# Patient Record
Sex: Male | Born: 1970 | Race: White | Hispanic: No | Marital: Single | Smoking: Never smoker
Health system: Southern US, Community
[De-identification: ages and names within clinical notes are randomized; demographics above are authoritative.]

---

## 2005-06-21 ENCOUNTER — Emergency Department: Payer: Self-pay | Admitting: Emergency Medicine

## 2005-06-22 ENCOUNTER — Other Ambulatory Visit: Payer: Self-pay

## 2005-07-07 ENCOUNTER — Inpatient Hospital Stay: Payer: Self-pay | Admitting: Unknown Physician Specialty

## 2007-04-19 IMAGING — CR DG CHEST 2V
1 series · 3 of 3 positions shown · non-contrast
Comparison: none

REASON FOR EXAM: anxious
COMMENTS:

PROCEDURE:     DXR - DXR CHEST PA (OR AP) AND LATERAL  - June 22, 2005  [DATE]
RESULT:          PA and lateral view reveals the cardiomediastinal
structures to be within normal limits.  The lung fields are clear.  The
vascularity is within normal limits with no effusions or pneumothoraces.

[Series 1: view not recorded · 0.17mm/px · 3 of 3 slices shown]
[im 1/3]
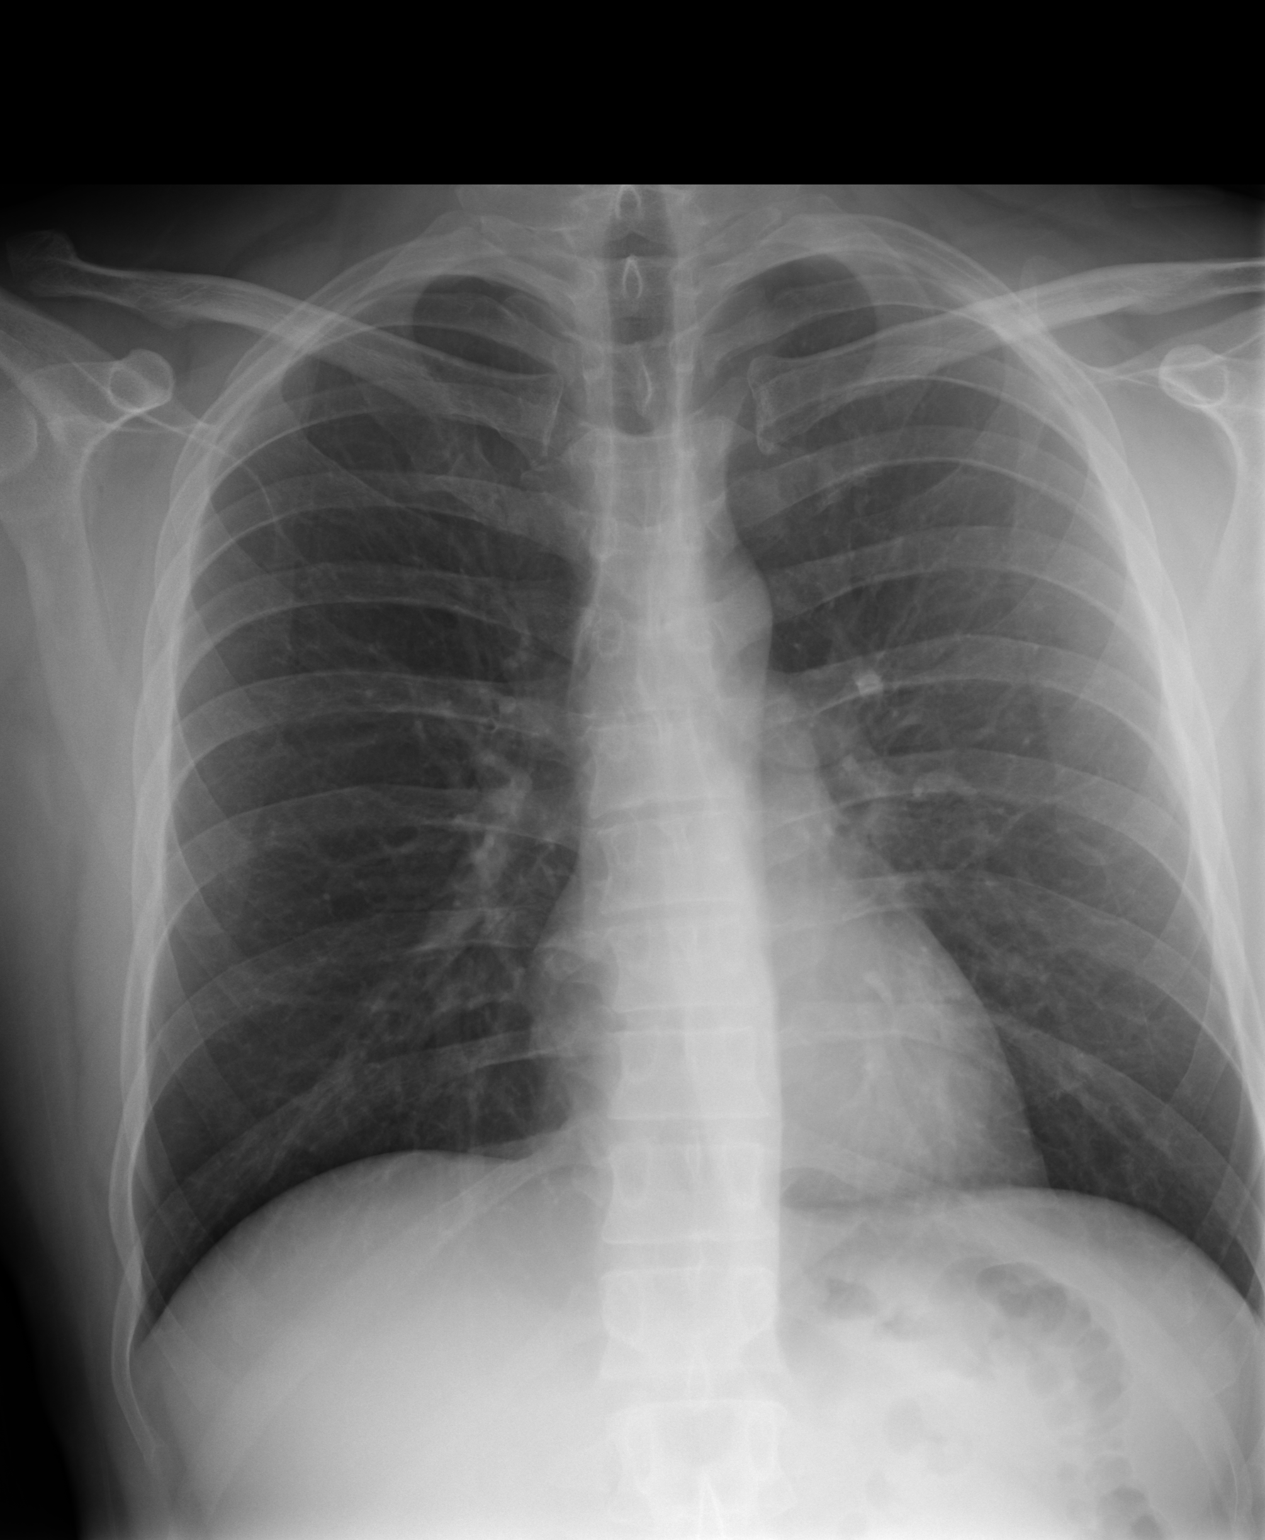
[im 2/3]
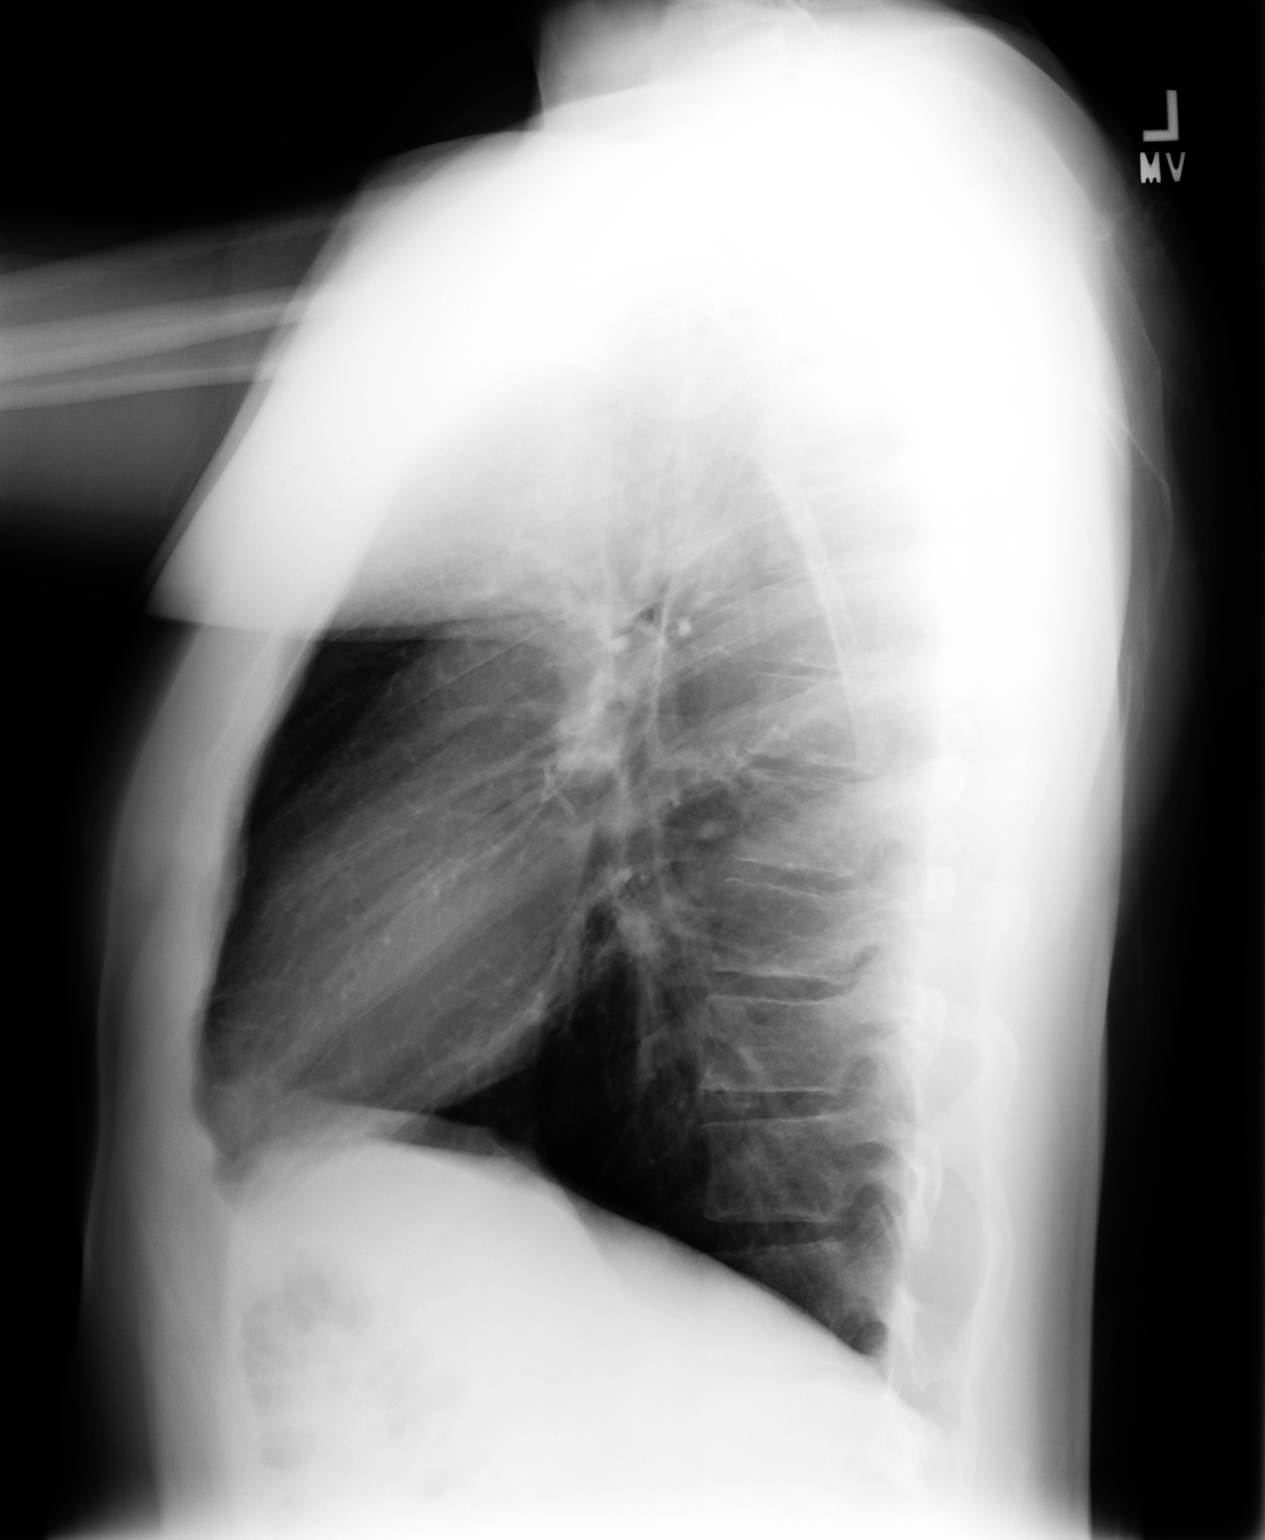
[im 3/3]
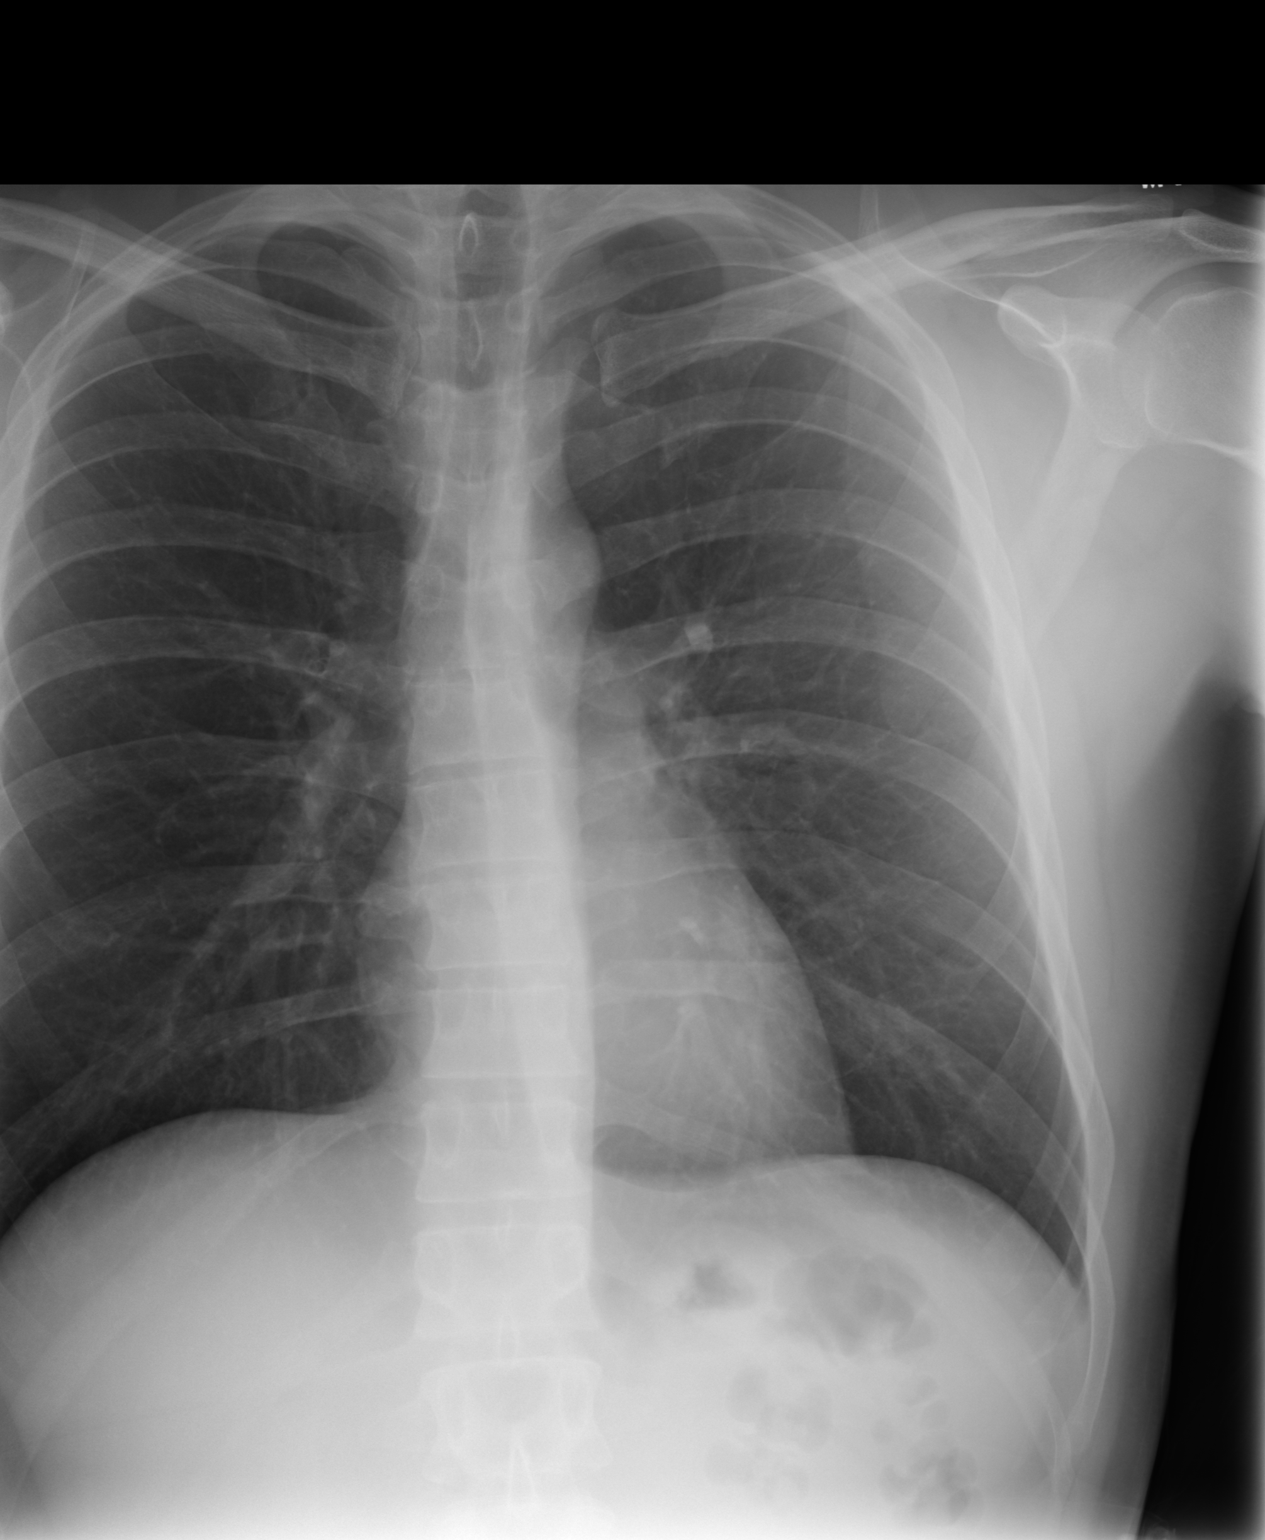

[3 of 3 positions shown; findings below may reference images not displayed]

IMPRESSION: The lung fields are clear.

## 2009-02-19 ENCOUNTER — Ambulatory Visit: Payer: Self-pay | Admitting: Internal Medicine

## 2010-07-21 ENCOUNTER — Emergency Department: Payer: Self-pay | Admitting: Internal Medicine

## 2011-06-25 ENCOUNTER — Ambulatory Visit: Payer: Self-pay | Admitting: Family Medicine

## 2013-04-21 IMAGING — US US PELVIS LIMITED
1 series · 14 of 25 positions shown · non-contrast
Comparison: None

REASON FOR EXAM: CR [id number] Testicular pain
COMMENTS:

PROCEDURE:     US  - US TESTICULAR  - June 25, 2011  [DATE]
RESULT:     Indication: Testicular pain
TECHNIQUE: Multiple gray-scale, color-flow Doppler, and spectral waveform
tracings of the testicles and testicular vasculature are presented for
review.

[Series 1: us pelvis limited · 0.08mm/px · 14 of 77 slices shown]
[im 1/77]
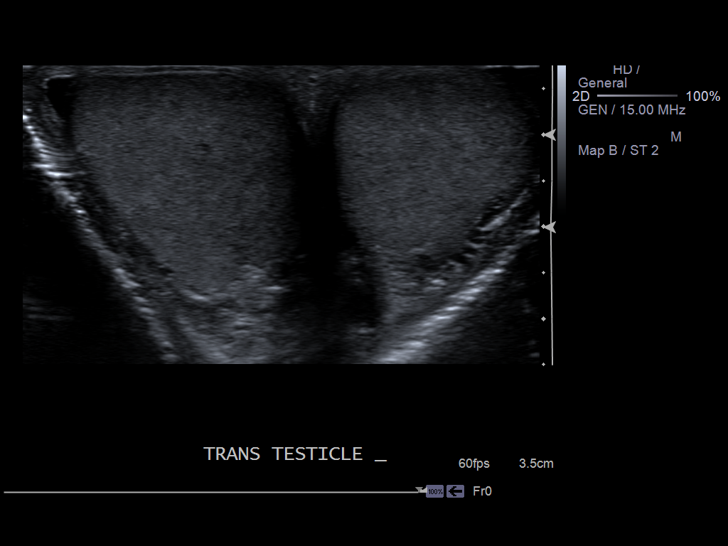
[im 7/77]
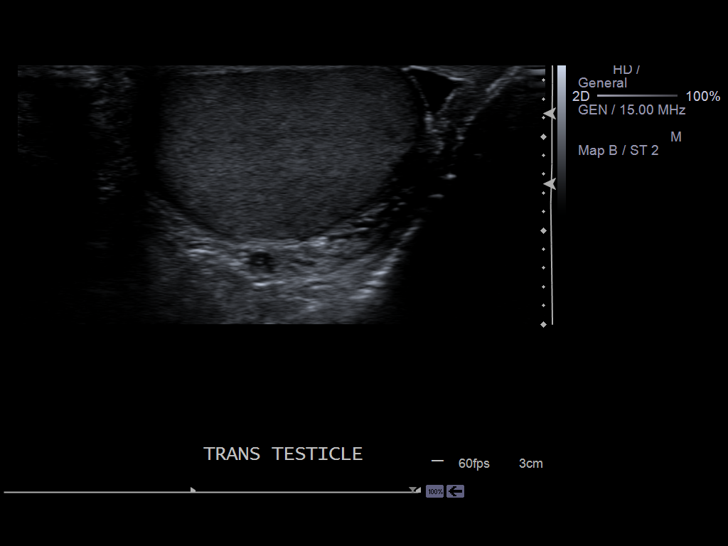
[im 13/77]
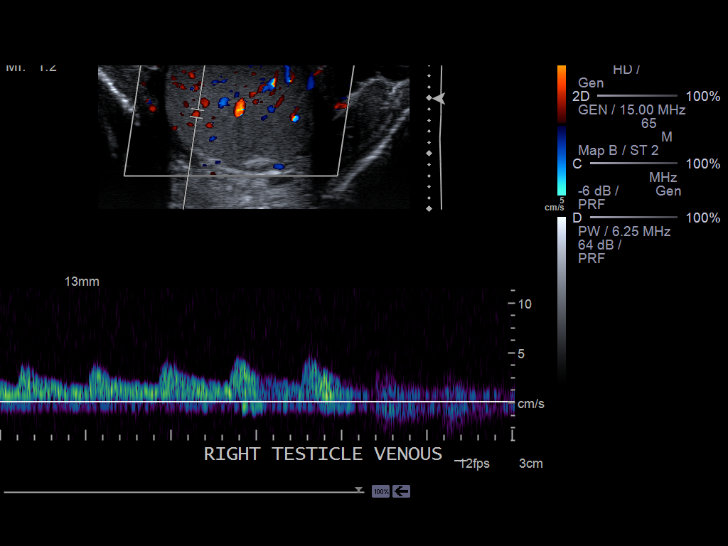
[im 20/77]
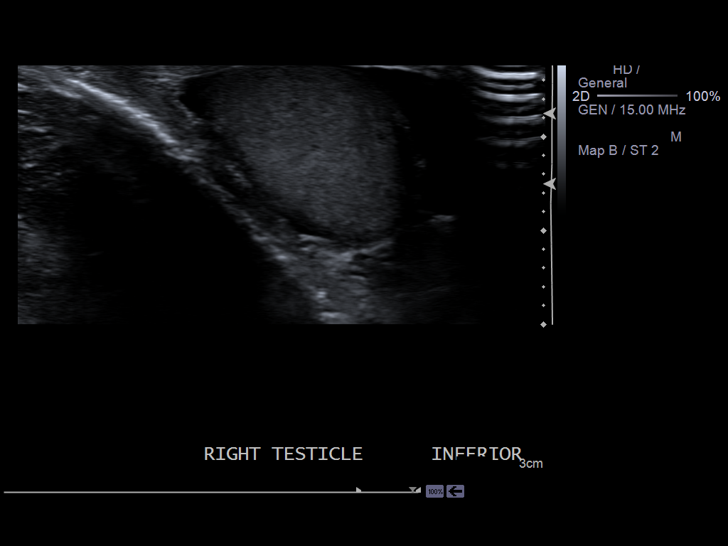
[im 26/77]
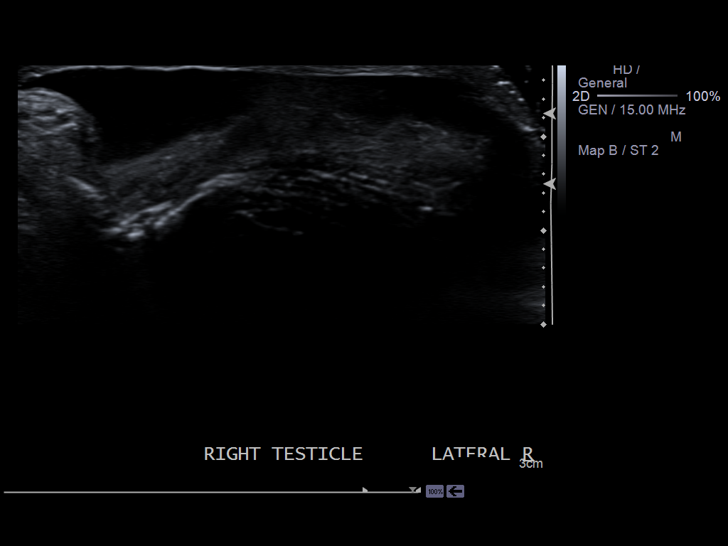
[im 29/77]
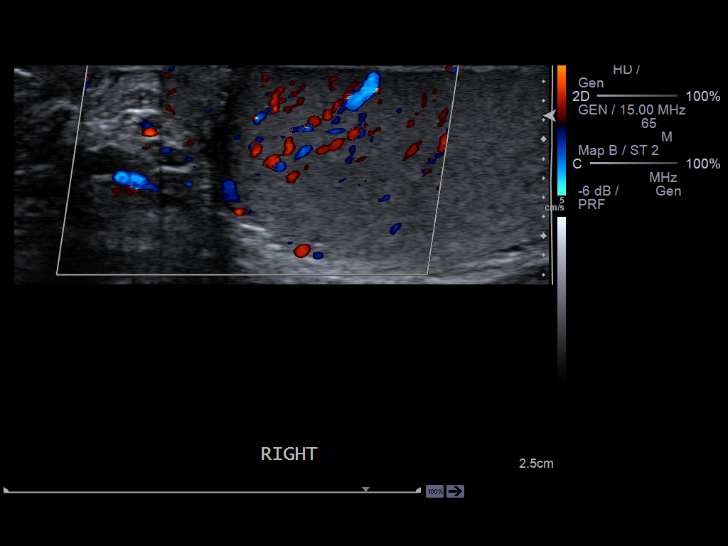
[im 35/77]
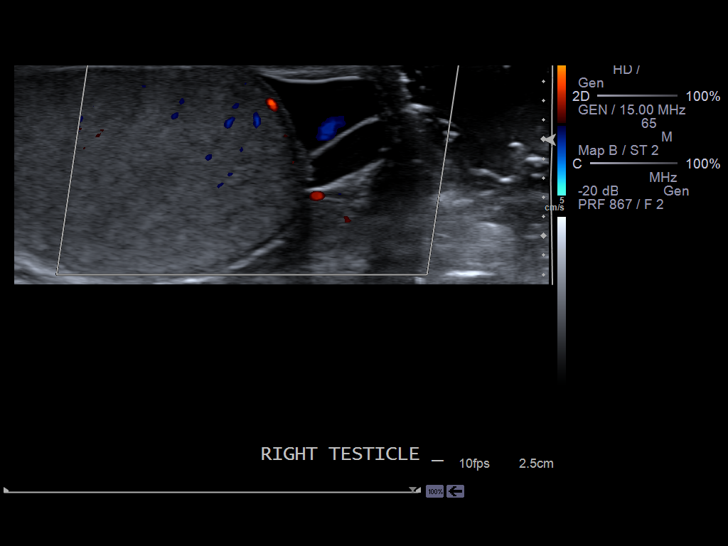
[im 42/77]
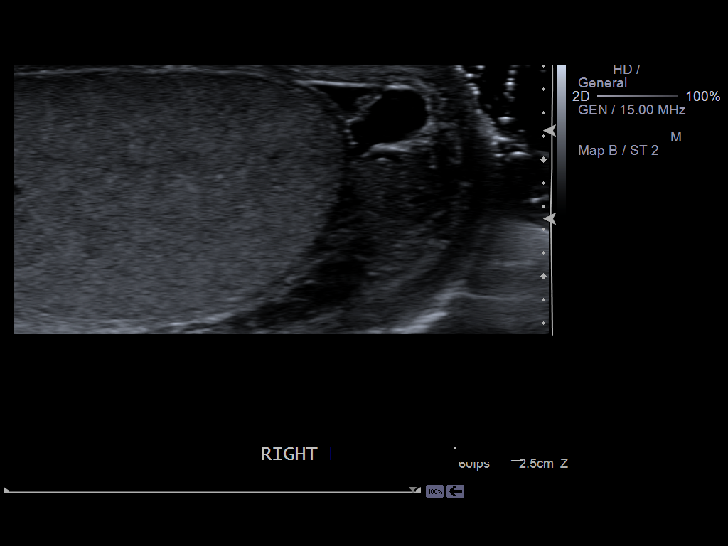
[im 48/77]
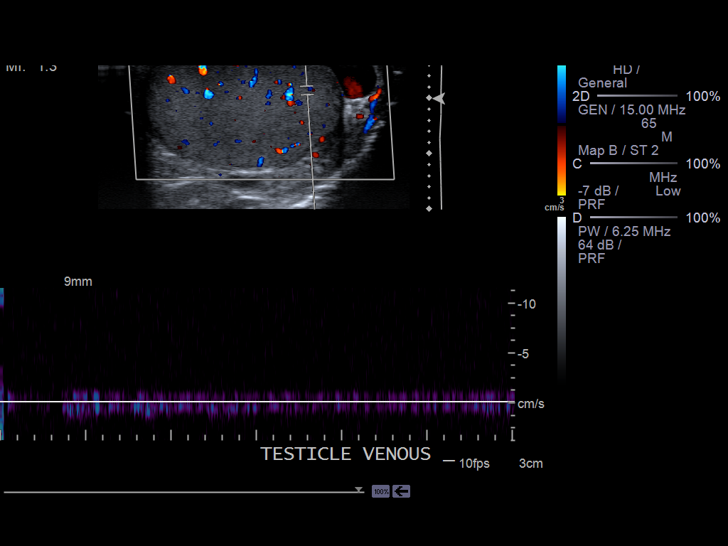
[im 51/77]
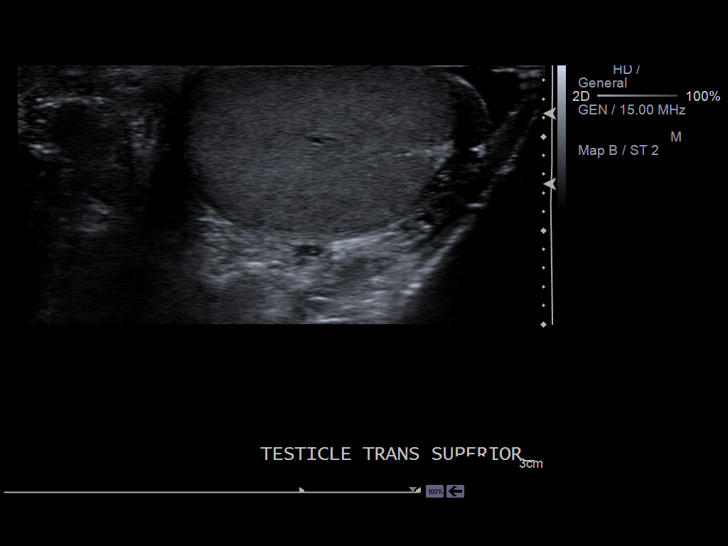
[im 58/77]
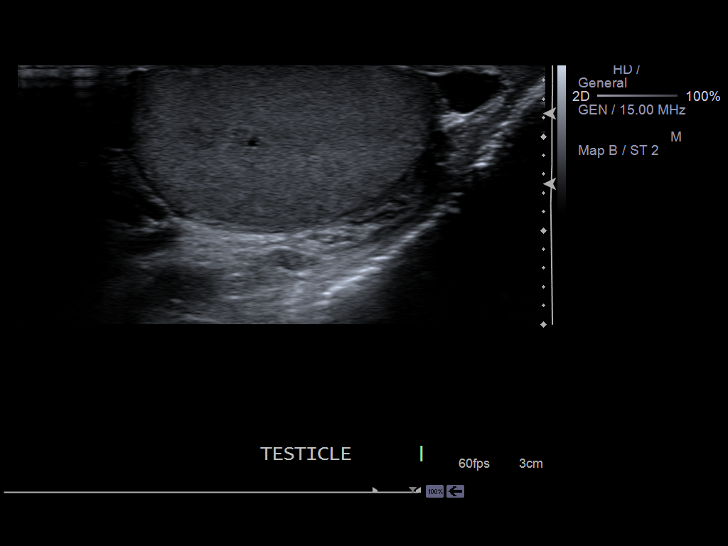
[im 64/77]
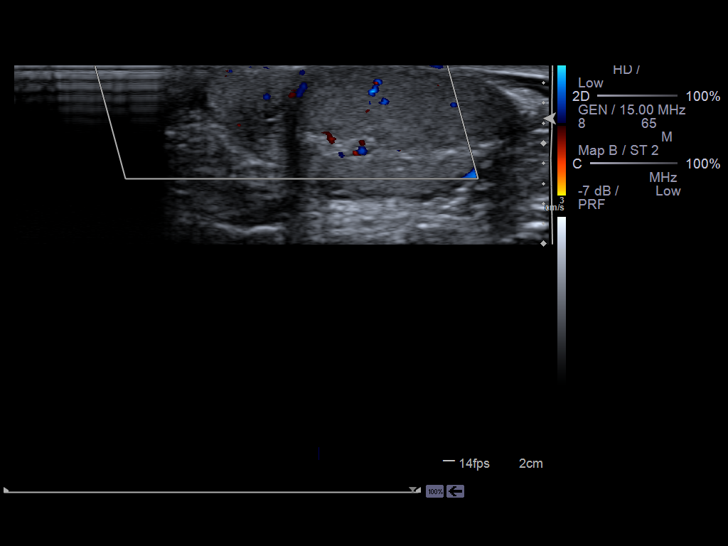
[im 70/77]
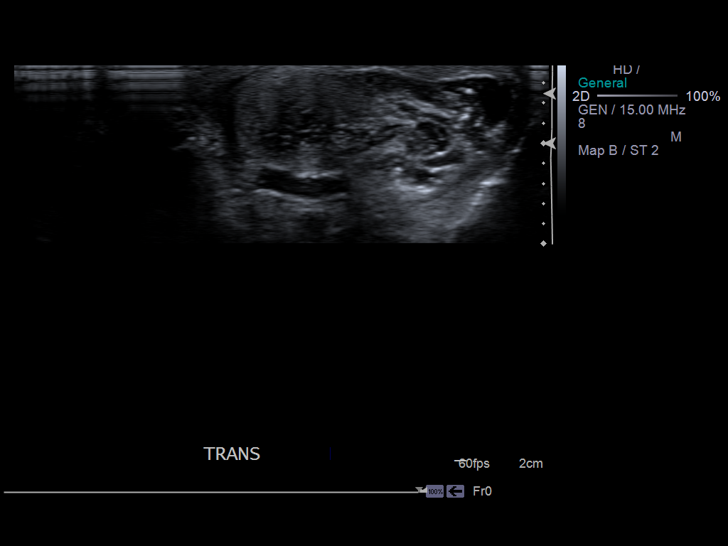
[im 77/77]
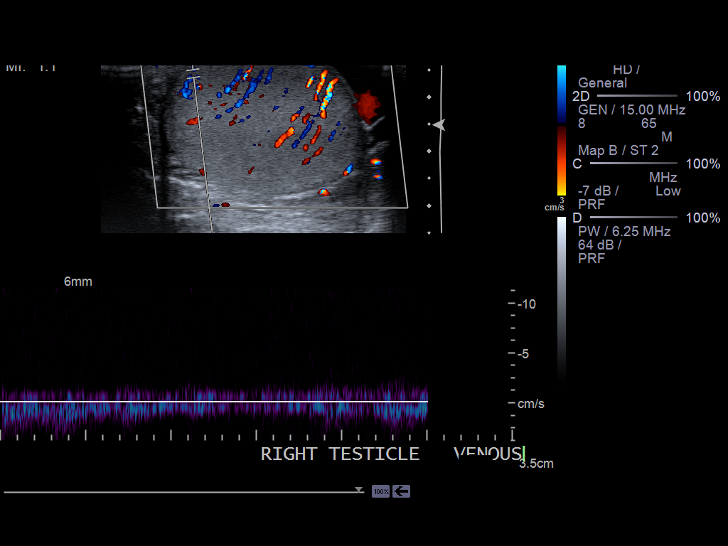

[14 of 25 positions shown; findings below may reference images not displayed]

FINDINGS: The right testicle is of normal echogenicity and measures 4 x 2.4 x 2.2 cm.
No focal mass lesions. There is normal arterial and venous blood flow. The
right epididymal head measures 0.6 x 0.7 x 0.7 cm and is normal in
appearance. No right varicocele. There is a small right hydrocele. There is
a small 10 mm anechoic mass adjacent to the inferior margin of the
epididymis and testicle likely a representing a small cyst or loculated
hydrocele.

The left testicle is of normal echogenicity and measures 3.7 x 2.8 x 1.8 cm.
No focal mass lesions. There is normal arterial and venous blood flow.  The
left epididymal head measures 0.6 x 0.8 x 1 cm and is normal in appearance.
No left varicocele. There is a small left hydrocele.
IMPRESSION: 1. No evidence of active testicular torsion.
2. Small bilateral hydroceles.

## 2018-03-15 ENCOUNTER — Telehealth: Payer: Self-pay

## 2018-04-06 NOTE — Telephone Encounter (Signed)
error 

## 2019-02-24 ENCOUNTER — Encounter: Payer: Self-pay | Admitting: Urology

## 2019-02-24 ENCOUNTER — Other Ambulatory Visit: Payer: Self-pay

## 2019-02-24 ENCOUNTER — Ambulatory Visit (INDEPENDENT_AMBULATORY_CARE_PROVIDER_SITE_OTHER): Payer: BC Managed Care – PPO | Admitting: Urology

## 2019-02-24 VITALS — BP 147/81 | HR 80 | Ht 69.5 in | Wt 184.3 lb

## 2019-02-24 DIAGNOSIS — Z125 Encounter for screening for malignant neoplasm of prostate: Secondary | ICD-10-CM | POA: Diagnosis not present

## 2019-02-24 MED ORDER — TADALAFIL 5 MG PO TABS: 5.0000 mg | ORAL_TABLET | Freq: Every day | ORAL | 6 refills | Status: AC | PRN

## 2019-02-24 NOTE — Patient Instructions (Addendum)
Prostate Cancer Screening  Prostate cancer screening is a test that is done to check for the presence of prostate cancer in men. The prostate gland is a walnut-sized gland that is located below the bladder and in front of the rectum in males. The function of the prostate is to add fluid to semen during ejaculation. Prostate cancer is the second most common type of cancer in men. Who should have prostate cancer screening?  Screening recommendations vary based on age and other risk factors. Screening is recommended if:  You are older than age 55. If you are age 55-69, talk with your health care provider about your need for screening and how often screening should be done. Because most prostate cancers are slow growing and will not cause death, screening is generally reserved in this age group for men who have a 10-15-year life expectancy.  You are younger than age 55, and you have these risk factors: ? Being a black male or a male of African descent. ? Having a father, brother, or uncle who has been diagnosed with prostate cancer. The risk is higher if your family member's cancer occurred at an early age. Screening is not recommended if:  You are younger than age 40.  You are between the ages of 40 and 54 and you have no risk factors.  You are 70 years of age or older. At this age, the risks that screening can cause are greater than the benefits that it may provide. If you are at high risk for prostate cancer, your health care provider may recommend that you have screenings more often or that you start screening at a younger age. How is screening for prostate cancer done? The recommended prostate cancer screening test is a blood test called the prostate-specific antigen (PSA) test. PSA is a protein that is made in the prostate. As you age, your prostate naturally produces more PSA. Abnormally high PSA levels may be caused by:  Prostate cancer.  An enlarged prostate that is not caused by cancer  (benign prostatic hyperplasia, BPH). This condition is very common in older men.  A prostate gland infection (prostatitis). Depending on the PSA results, you may need more tests, such as:  A physical exam to check the size of your prostate gland.  Blood and imaging tests.  A procedure to remove tissue samples from your prostate gland for testing (biopsy). What are the benefits of prostate cancer screening?  Screening can help to identify cancer at an early stage, before symptoms start and when the cancer can be treated more easily.  There is a small chance that screening may lower your risk of dying from prostate cancer. The chance is small because prostate cancer is a slow-growing cancer, and most men with prostate cancer die from a different cause. What are the risks of prostate cancer screening? The main risk of prostate cancer screening is diagnosing and treating prostate cancer that would never have caused any symptoms or problems. This is called overdiagnosisand overtreatment. PSA screening cannot tell you if your PSA is high due to cancer or a different cause. A prostate biopsy is the only procedure to diagnose prostate cancer. Even the results of a biopsy may not tell you if your cancer needs to be treated. Slow-growing prostate cancer may not need any treatment other than monitoring, so diagnosing and treating it may cause unnecessary stress or other side effects. A prostate biopsy may also cause:  Infection or fever.  A false negative. This is   a result that shows that you do not have prostate cancer when you actually do have prostate cancer. Questions to ask your health care provider  When should I start prostate cancer screening?  What is my risk for prostate cancer?  How often do I need screening?  What type of screening tests do I need?  How do I get my test results?  What do my results mean?  Do I need treatment? Where to find more information  The American Cancer  Society: www.cancer.org  American Urological Association: www.auanet.org Contact a health care provider if:  You have difficulty urinating.  You have pain when you urinate or ejaculate.  You have blood in your urine or semen.  You have pain in your back or in the area of your prostate. Summary  Prostate cancer is a common type of cancer in men. The prostate gland is located below the bladder and in front of the rectum. This gland adds fluid to semen during ejaculation.  Prostate cancer screening may identify cancer at an early stage, when the cancer can be treated more easily.  The prostate-specific antigen (PSA) test is the recommended screening test for prostate cancer.  Discuss the risks and benefits of prostate cancer screening with your health care provider. If you are age 70 or older, the risks that screening can cause are greater than the benefits that it may provide. This information is not intended to replace advice given to you by your health care provider. Make sure you discuss any questions you have with your health care provider. Document Revised: 08/26/2018 Document Reviewed: 08/26/2018 Elsevier Patient Education  2020 [employer] Inc.  Erectile Dysfunction Erectile dysfunction (ED) is the inability to get or keep an erection in order to have sexual intercourse. Erectile dysfunction may include:  Inability to get an erection.  Lack of enough hardness of the erection to allow penetration.  Loss of the erection before sex is finished. What are the causes? This condition may be caused by:  Certain medicines, such as: ? Pain relievers. ? Antihistamines. ? Antidepressants. ? Blood pressure medicines. ? Water pills (diuretics). ? Ulcer medicines. ? Muscle relaxants. ? Drugs.  Excessive drinking.  Psychological causes, such as: ? Anxiety. ? Depression. ? Sadness. ? Exhaustion. ? Performance fear. ? Stress.  Physical causes, such as: ? Artery problems.  This may include diabetes, smoking, liver disease, or atherosclerosis. ? High blood pressure. ? Hormonal problems, such as low testosterone. ? Obesity. ? Nerve problems. This may include back or pelvic injuries, diabetes mellitus, multiple sclerosis, or Parkinson disease. What are the signs or symptoms? Symptoms of this condition include:  Inability to get an erection.  Lack of enough hardness of the erection to allow penetration.  Loss of the erection before sex is finished.  Normal erections at some times, but with frequent unsatisfactory episodes.  Low sexual satisfaction in either partner due to erection problems.  A curved penis occurring with erection. The curve may cause pain or the penis may be too curved to allow for intercourse.  Never having nighttime erections. How is this diagnosed? This condition is often diagnosed by:  Performing a physical exam to find other diseases or specific problems with the penis.  Asking you detailed questions about the problem.  Performing blood tests to check for diabetes mellitus or to measure hormone levels.  Performing other tests to check for underlying health conditions.  Performing an ultrasound exam to check for scarring.  Performing a test to check blood   flow to the penis.  Doing a sleep study at home to measure nighttime erections. How is this treated? This condition may be treated by:  Medicine taken by mouth to help you achieve an erection (oral medicine).  Hormone replacement therapy to replace low testosterone levels.  Medicine that is injected into the penis. Your health care provider may instruct you how to give yourself these injections at home.  Vacuum pump. This is a pump with a ring on it. The pump and ring are placed on the penis and used to create pressure that helps the penis become erect.  Penile implant surgery. In this procedure, you may receive: ? An inflatable implant. This consists of cylinders, a  pump, and a reservoir. The cylinders can be inflated with a fluid that helps to create an erection, and they can be deflated after intercourse. ? A semi-rigid implant. This consists of two silicone rubber rods. The rods provide some rigidity. They are also flexible, so the penis can both curve downward in its normal position and become straight for sexual intercourse.  Blood vessel surgery, to improve blood flow to the penis. During this procedure, a blood vessel from a different part of the body is placed into the penis to allow blood to flow around (bypass) damaged or blocked blood vessels.  Lifestyle changes, such as exercising more, losing weight, and quitting smoking. Follow these instructions at home: Medicines   Take over-the-counter and prescription medicines only as told by your health care provider. Do not increase the dosage without first discussing it with your health care provider.  If you are using self-injections, perform injections as directed by your health care provider. Make sure to avoid any veins that are on the surface of the penis. After giving an injection, apply pressure to the injection site for 5 minutes. General instructions  Exercise regularly, as directed by your health care provider. Work with your health care provider to lose weight, if needed.  Do not use any products that contain nicotine or tobacco, such as cigarettes and e-cigarettes. If you need help quitting, ask your health care provider.  Before using a vacuum pump, read the instructions that come with the pump and discuss any questions with your health care provider.  Keep all follow-up visits as told by your health care provider. This is important. Contact a health care provider if:  You feel nauseous.  You vomit. Get help right away if:  You are taking oral or injectable medicines and you have an erection that lasts longer than 4 hours. If your health care provider is unavailable, go to the  nearest emergency room for evaluation. An erection that lasts much longer than 4 hours can result in permanent damage to your penis.  You have severe pain in your groin or abdomen.  You develop redness or severe swelling of your penis.  You have redness spreading up into your groin or lower abdomen.  You are unable to urinate.  You experience chest pain or a rapid heart beat (palpitations) after taking oral medicines. Summary  Erectile dysfunction (ED) is the inability to get or keep an erection during sexual intercourse. This problem can usually be treated successfully.  This condition is diagnosed based on a physical exam, your symptoms, and tests to determine the cause. Treatment varies depending on the cause, and may include medicines, hormone therapy, surgery, or vacuum pump.  You may need follow-up visits to make sure that you are using your medicines or devices correctly.    Get help right away if you are taking or injecting medicines and you have an erection that lasts longer than 4 hours. This information is not intended to replace advice given to you by your health care provider. Make sure you discuss any questions you have with your health care provider. Document Revised: 12/26/2016 Document Reviewed: 01/30/2016 Elsevier Patient Education  2020 Elsevier Inc.  

## 2019-02-24 NOTE — Progress Notes (Signed)
   02/24/19 2:30 PM   Jaynee Eagles August 22, 1970 [id number]  Referring provider: Titus Mould, NP [address] [place],  [place] [address]  CC: PSA screening  HPI: I saw Mr. Tomei in urology clinic to discuss PSA screening.  He is a healthy 49 year old male with past medical history notable for hypertension and erectile dysfunction on sildenafil who was recently found to have a PSA of 1.7.  There are no prior PSA values to compare to.  He denies any family history of prostate or breast cancer.  He denies any urinary symptoms, gross hematuria, flank pain, weight loss, or bone pain.  He feels that the sildenafil helps significantly with his ED, and his erections are approximately 90% with the used to be with the sildenafil.   PMH: Hypertension  Surgical History: No prior urologic surgeries  Allergies: No Known Allergies  Family History: Family History  Problem Relation Age of Onset  . Heart attack Father   . Prostate cancer Neg Hx     Social History:  reports that he has never smoked. He has never used smokeless tobacco. He reports previous alcohol use. He reports that he does not use drugs.  ROS: Please see flowsheet from today's date for complete review of systems.  Physical Exam: BP (!) 147/81 (BP Location: Left Arm, Patient Position: Sitting, Cuff Size: Normal)   Pulse 80   Ht 5' 9.5" (1.765 m)   Wt 184 lb 4.8 oz (83.6 kg)   BMI 26.83 kg/m    Constitutional:  Alert and oriented, No acute distress. Cardiovascular: No clubbing, cyanosis, or edema. Respiratory: Normal respiratory effort, no increased work of breathing. GI: Abdomen is soft, nontender, nondistended, no abdominal masses DRE: Deferred Lymph: No cervical or inguinal lymphadenopathy. Skin: No rashes, bruises or suspicious lesions. Neurologic: Grossly intact, no focal deficits, moving all 4 extremities. Psychiatric: Normal mood and affect.  Laboratory Data: Reviewed, see HPI  Pertinent  Imaging: None to review  Assessment & Plan:   In summary, the patient is a healthy 49 year old male with no family history of prostate cancer with an isolated PSA value of 1.7.  We reviewed the AUA guidelines that do not recommend routine PSA screening for men under age 22 with no family history of prostate cancer.  At his age, I would not consider a PSA of 1.7 an elevation, and I recommended discontinuing screening until age 49.  We discussed the risks and benefits at length, and he is in agreement.  Resume PSA screening per guideline recommendations at age 35 with PCP  A total of 30 minutes were spent face-to-face with the patient, greater than 50% was spent in patient education, counseling, and coordination of care regarding PSA screening.   Sondra Come, MD  [facility] [address], [address] [place], [place] [address] [phone]
